# Patient Record
Sex: Female | Born: 1965 | Race: White | Hispanic: No | Marital: Married | Smoking: Former smoker
Health system: Southern US, Community
[De-identification: ages and names within clinical notes are randomized; demographics above are authoritative.]

## PROBLEM LIST (undated history)

## (undated) DIAGNOSIS — N63 Unspecified lump in unspecified breast: Secondary | ICD-10-CM

## (undated) DIAGNOSIS — N61 Mastitis without abscess: Secondary | ICD-10-CM

## (undated) DIAGNOSIS — R102 Pelvic and perineal pain: Secondary | ICD-10-CM

## (undated) DIAGNOSIS — E785 Hyperlipidemia, unspecified: Secondary | ICD-10-CM

## (undated) DIAGNOSIS — C50919 Malignant neoplasm of unspecified site of unspecified female breast: Secondary | ICD-10-CM

## (undated) DIAGNOSIS — R350 Frequency of micturition: Secondary | ICD-10-CM

## (undated) HISTORY — DX: Hyperlipidemia, unspecified: E78.5

## (undated) HISTORY — DX: Frequency of micturition: R35.0

## (undated) HISTORY — DX: Pelvic and perineal pain: R10.2

## (undated) HISTORY — DX: Malignant neoplasm of unspecified site of unspecified female breast: C50.919

## (undated) HISTORY — DX: Mastitis without abscess: N61.0

## (undated) HISTORY — DX: Unspecified lump in unspecified breast: N63.0

---
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
## 2004-03-09 ENCOUNTER — Ambulatory Visit: Payer: Self-pay | Admitting: Family Medicine

## 2004-03-20 ENCOUNTER — Encounter: Admission: RE | Admit: 2004-03-20 | Discharge: 2004-03-20 | Payer: Self-pay | Admitting: Family Medicine

## 2004-03-24 ENCOUNTER — Encounter (INDEPENDENT_AMBULATORY_CARE_PROVIDER_SITE_OTHER): Payer: Self-pay | Admitting: *Deleted

## 2004-04-23 ENCOUNTER — Ambulatory Visit: Payer: Self-pay | Admitting: Family Medicine

## 2004-06-24 HISTORY — PX: DILATION AND CURETTAGE OF UTERUS: SHX78

## 2004-07-13 ENCOUNTER — Ambulatory Visit: Payer: Self-pay | Admitting: Sports Medicine

## 2004-09-04 ENCOUNTER — Ambulatory Visit: Payer: Self-pay | Admitting: Family Medicine

## 2004-10-04 ENCOUNTER — Ambulatory Visit (HOSPITAL_COMMUNITY): Admission: RE | Admit: 2004-10-04 | Discharge: 2004-10-04 | Payer: Self-pay | Admitting: Obstetrics & Gynecology

## 2004-10-04 ENCOUNTER — Encounter (INDEPENDENT_AMBULATORY_CARE_PROVIDER_SITE_OTHER): Payer: Self-pay | Admitting: *Deleted

## 2004-12-31 ENCOUNTER — Other Ambulatory Visit: Admission: RE | Admit: 2004-12-31 | Discharge: 2004-12-31 | Payer: Self-pay | Admitting: Obstetrics & Gynecology

## 2005-04-01 ENCOUNTER — Other Ambulatory Visit: Admission: RE | Admit: 2005-04-01 | Discharge: 2005-04-01 | Payer: Self-pay | Admitting: Obstetrics & Gynecology

## 2005-04-02 ENCOUNTER — Other Ambulatory Visit: Admission: RE | Admit: 2005-04-02 | Discharge: 2005-04-02 | Payer: Self-pay | Admitting: Obstetrics & Gynecology

## 2005-06-24 HISTORY — PX: BLADDER SURGERY: SHX569

## 2005-09-08 ENCOUNTER — Emergency Department (HOSPITAL_COMMUNITY): Admission: EM | Admit: 2005-09-08 | Discharge: 2005-09-08 | Payer: Self-pay | Admitting: Emergency Medicine

## 2005-11-07 ENCOUNTER — Inpatient Hospital Stay (HOSPITAL_COMMUNITY): Admission: AD | Admit: 2005-11-07 | Discharge: 2005-11-09 | Payer: Self-pay | Admitting: Obstetrics and Gynecology

## 2005-11-10 ENCOUNTER — Encounter: Admission: RE | Admit: 2005-11-10 | Discharge: 2005-12-10 | Payer: Self-pay | Admitting: Obstetrics and Gynecology

## 2006-08-22 ENCOUNTER — Encounter (INDEPENDENT_AMBULATORY_CARE_PROVIDER_SITE_OTHER): Payer: Self-pay | Admitting: *Deleted

## 2006-09-16 ENCOUNTER — Encounter (INDEPENDENT_AMBULATORY_CARE_PROVIDER_SITE_OTHER): Payer: Self-pay | Admitting: *Deleted

## 2006-09-16 ENCOUNTER — Ambulatory Visit (HOSPITAL_COMMUNITY): Admission: RE | Admit: 2006-09-16 | Discharge: 2006-09-17 | Payer: Self-pay | Admitting: Obstetrics and Gynecology

## 2008-09-30 ENCOUNTER — Encounter: Admission: RE | Admit: 2008-09-30 | Discharge: 2008-09-30 | Payer: Self-pay | Admitting: Chiropractic Medicine

## 2010-10-18 ENCOUNTER — Other Ambulatory Visit: Payer: Self-pay

## 2010-10-18 ENCOUNTER — Other Ambulatory Visit (HOSPITAL_COMMUNITY)
Admission: RE | Admit: 2010-10-18 | Discharge: 2010-10-18 | Disposition: A | Discharge status: Home / Self Care | Payer: BC Managed Care – PPO | Source: Ambulatory Visit | Attending: Internal Medicine | Admitting: Internal Medicine

## 2010-10-18 ENCOUNTER — Other Ambulatory Visit: Payer: Self-pay | Admitting: Family Medicine

## 2010-10-18 DIAGNOSIS — Z1231 Encounter for screening mammogram for malignant neoplasm of breast: Secondary | ICD-10-CM

## 2010-10-18 DIAGNOSIS — Z01419 Encounter for gynecological examination (general) (routine) without abnormal findings: Secondary | ICD-10-CM | POA: Insufficient documentation

## 2010-11-09 NOTE — Op Note (Signed)
Mandy Jackson, Mandy Jackson                ACCOUNT NO.:  [id number]   MEDICAL RECORD NO.:  [id number]          PATIENT TYPE:  AMB   LOCATION:  SDC                           FACILITY:  [facility]   PHYSICIAN:  Miguel Aschoff, M.D.       DATE OF BIRTH:  1966-01-23   DATE OF PROCEDURE:  09/16/2006  DATE OF DISCHARGE:                               OPERATIVE REPORT   PREOPERATIVE DIAGNOSIS:  Painful episiotomy scar.   POSTOPERATIVE DIAGNOSIS:  Painful episiotomy scar.   PROCEDURE:  Revision of episiotomy scar.   SURGEON:  Dr. Miguel Aschoff   ASSISTANT:  Dr. Conley Simmonds   ANESTHESIA:  General.   COMPLICATIONS:  None.   JUSTIFICATION:  The patient is a 45 year old white female who delivered  on Nov 07, 2005.  The patient has reported dyspareunia in the area of  her episiotomy scar.  Examination reveals an area of fibrosis and  tenderness, and this area is to be excised and revised in an effort to  relieve the dyspareunia.  In addition, the patient has urinary stress  incontinence and a cystocele and prior to the revision of the  episiotomy, a TVT sling and anterior colporrhaphy were carried out by  Dr. Conley Simmonds, and this is dictated as a separate note.   PROCEDURE:  At the completion of the anterior colporrhaphy and TVT  sling, the area of fibrosis involving the episiotomy scar was  identified.  This area was then elevated with Allis clamps, and then  this was dissected out using a scalpel.  After tissue specimen was  excised, there was no longer any thickening felt in the residual portion  of the posterior vaginal wall.  This area was then closed using running  interlocking 2-0 Vicryl Rapide sutures.  Estimated blood loss from this  portion of procedure was minimal, less than 10 mL.  The patient  tolerated the procedure well and was taken to the recovery room in  satisfactory condition.  The patient is to remain in the hospital  overnight because of her anterior colporrhaphy and TVT sling.  The  patient tolerated all the surgical procedures well and was taken to the  recovery room in satisfactory condition.      Miguel Aschoff, M.D.  Electronically Signed     AR/MEDQ  D:  09/16/2006  T:  09/16/2006  Job:  [id number]

## 2010-11-09 NOTE — Discharge Summary (Signed)
Mandy Jackson, Mandy Jackson                ACCOUNT NO.:  [id number]   MEDICAL RECORD NO.:  [id number]          PATIENT TYPE:  OIB   LOCATION:  9315                          FACILITY:  [facility]   PHYSICIAN:  Miguel Aschoff, M.D.       DATE OF BIRTH:  1965/11/01   DATE OF ADMISSION:  09/16/2006  DATE OF DISCHARGE:  09/17/2006                               DISCHARGE SUMMARY   ADMISSION DIAGNOSES:  1. Urinary stress incontinence.  2. Painful scarring at episiotomy site.   FINAL DIAGNOSES:  1. Urinary stress incontinence.  2. Painful scarring at episiotomy site.   OPERATIONS AND PROCEDURES:  Anterior repair.  TVT sling.  Cystoscopy  followed by excision of a revision of episiotomy scar.   COMPLICATIONS:  None.   JUSTIFICATION:  The patient is a 45 year old white female who delivered  on Nov 07, 2005.  This delivery was uncomplicated.  She did, however,  have a second-degree perineal laceration.  Subsequent to this, the  patient had developed significant urinary stress incontinence, and  because of the stress incontinence, she has requested that a procedure  be carried out in an effort to correct this problem.  She was offered  surgical repair with a TVT sling and anterior colporrhaphy.  In  addition, the patient has stated that she keeps having pain in the area  where the perineal tear occurred.  She has very tender area at the right  posterior portion of the vagina near the fourchette, and this area is  going to be incised in an effort to try to reduce the patient's  discomfort in this area.  The risks and benefits were discussed with the  patient, and informed consent was obtained.   HOSPITAL COURSE:  On September 16, 2006 under general anesthesia, an  anterior colporrhaphy, TVT sling and cystoscopy were carried out by Dr.  Edward Jolly.  Following these repairs, the scarred area of the posterior  vagina was outlined, and then this area was excised and then  reapproximated using 2-0 Vicryl Rapide suture.   The patient tolerated  these procedures without difficulty, had an uneventful postoperative  course, and was discharged home on September 16, 2006 in satisfactory  condition.   Plan was for the patient to return to the office in 2 weeks for follow-  up examination.  She was to call for any problems such as fever, pain or  heavy bleeding.   Medications for home were:  1. Percocet 1 every 3 hours as needed for pain.  2. Ibuprofen.  3. Macrobid 100 mg b.i.d.  4. Azo-Standard as needed for dysuria.   The patient is to call for any problems such as fever, pain or heavy  bleeding.      Miguel Aschoff, M.D.  Electronically Signed     AR/MEDQ  D:  10/29/2006  T:  10/29/2006  Job:  [id number]

## 2010-11-09 NOTE — Op Note (Signed)
Mandy Jackson, Mandy Jackson                ACCOUNT NO.:  [id number]   MEDICAL RECORD NO.:  [id number]          PATIENT TYPE:  AMB   LOCATION:  SDC                           FACILITY:  [facility]   PHYSICIAN:  Gerrit Friends. Aldona Bar, M.D.   DATE OF BIRTH:  1965-09-05   DATE OF PROCEDURE:  10/04/2004  DATE OF DISCHARGE:                                 OPERATIVE REPORT   PATIENT'S AGE:  45.   PREOPERATIVE DIAGNOSIS:  First trimester pregnancy loss, blood type A+.   POSTOPERATIVE DIAGNOSIS:  First trimester pregnancy loss, blood type A+.   PATHOLOGY:  Pending - tissue sent for chromosomes as well.   PROCEDURE:  Suction dilatation and curettage.   SURGEON:  Dr. Aldona Bar.   ANESTHESIA:  Intravenous conscious sedation and paracervical block with 1%  Xylocaine without epinephrine.   HISTORY:  This gravida 2, para 0 presented to the office in mid March as a  new OB patient. She had several ultrasounds in the office documenting good  growth and when seen prior to yesterday - on September 12, 2004 - had a 7-week 5-  day fetal pole with a heart beat - everything was consistent with dates and  she was doing well. She returned to the office for a routine visit on October 03, 2004.  Fetal heart could not be heard and an ultrasound was done which  revealed a fetal pole about 9 weeks and 2 days when the patient should have  been 10 weeks and 5 days and no fetal heart or fetal movement was seen. The  diagnosis of first trimester pregnancy loss was made and after a thorough  discussion with the patient and her husband, this procedure was arranged.  Tissue will be sent for chromosomal analysis in view of the patient's age.   The patient is taken to the operating room now for a suction dilatation and  curettage for evacuation of a first trimester pregnancy loss.   PROCEDURE:  The patient was taken to the operating room. After satisfactory  induction of intravenous conscious sedation, the patient was prepped and  draped in the  usual fashion. The bladder was drained of clear urine with a  red rubber catheter via in-and-out fashion. At this time, a speculum was  placed in the vagina. A single-toothed tenaculum was placed on the anterior  lip of the cervix and a paracervical block was carried out with  approximately 18 cc of 1% Xylocaine without epinephrine. With minimal  difficulty thereafter, the internal os was dilated to a #27 Pratt dilator  and thereafter using a #9 suction curet, the cavity was thoroughly gently  and systematically evacuated of products of conception.  There was a  moderate amount of bleeding noted after suctioning. Curettage with a small  standard curet was carried out with a little irregularity noted at the  fundus of the uterus but otherwise the cavity was smooth. Resuctioning  produced a small amount of additional tissue and recurettage produced no  additional tissue. There was still some moderate bleeding but with palpation  of the uterus after all instruments  had been removed, assured of no products  of conception remaining in the cavity, the bleeding slowed considerably and  stopped and the uterus firmed up well. There was a question of a small myoma  on the anterior wall of the uterus as well on the good examination under  anesthesia which was carried out while trying to firm up the uterus after  the procedure.   The patient was watched in the operating room with no further bleeding noted  and ultimately taken to the recovery room in good condition having tolerated  the procedure well. Estimated blood loss was approximately 50-75 cc. All  counts were correct x2. Tissue was sent to [facility] for chromosomal  analysis with all forms properly filled out.   Remaining tissue was sent to pathology.   The patient will be observed in the recovery room and discharged to home  with a prescription for doxycycline 100 mg twice daily for a total of five  days and Anaprox DS as needed for  cramping - to use every eight hours. She  will also be given a prescription for Methergine 0.2 mg to use every four to  six hours should she have some increased bleeding. Follow-up will be  arranged in approximately two weeks hence. Condition on arrival to the  recovery room satisfactory.      RMW/MEDQ  D:  10/04/2004  T:  10/04/2004  Job:  [id number]

## 2010-11-09 NOTE — Op Note (Signed)
NAMELIBERTA, Mandy Jackson                ACCOUNT NO.:  [id number]   MEDICAL RECORD NO.:  [id number]          PATIENT TYPE:  AMB   LOCATION:  SDC                           FACILITY:  [facility]   PHYSICIAN:  Randye Lobo, M.D.   DATE OF BIRTH:  05-16-1966   DATE OF PROCEDURE:  09/16/2006  DATE OF DISCHARGE:                               OPERATIVE REPORT   PREOPERATIVE DIAGNOSES:  1. Stress urinary incontinence.  2. Incomplete vaginal prolapse.   POSTOPERATIVE DIAGNOSES:  1. Stress urinary incontinence.  2. Incomplete vaginal prolapse.   PROCEDURE:  Tension-free vaginal tape suburethral sling with cystoscopy,  anterior colporrhaphy.   SURGEON:  Conley Simmonds, MD   ASSISTANT:  Miguel Aschoff, MD   ANESTHESIA:  General endotracheal.   URINE OUTPUT:  Quantity sufficient.   COMPLICATIONS:  None.   INDICATIONS FOR PROCEDURE:  The patient is a 45 year old Caucasian  female who presented with a complaint of urinary incontinence with  stressful maneuvers.  The urinary incontinence limited her ability to do  exercise and physical activity, and she wished for surgical treatment.  Office urodynamic testing confirmed the presence of genuine stress  incontinence.  On exam, the patient was noted to have a first to second-  degree cystocele.   The patient was planning a revision of a painful episiotomy site with  Dr. Tenny Craw, and the patient wished to proceed with surgical treatment of  her incontinence and prolapse during the same surgical opportunity.  Risks, benefits, and alternatives have been discussed with the patient  who wishes to proceed.   FINDINGS:  Examination under anesthesia revealed a first to second-  degree cystocele.   Cystoscopy documented the bladder to be normal throughout 360 degrees  including the bladder dome and trigone.  Each of the ureters were noted  to be patent bilaterally after an injection of indigo carmine dye IV.  There was no evidence of a foreign body in either the  bladder or the  urethra during placement of the sling.   SPECIMENS:  None.   PROCEDURE:  The patient was reidentified in the preoperative hold area.  She was brought down to the operating room where general endotracheal  anesthesia was induced.  The patient was placed in the dorsal lithotomy  position, and the lower abdomen and vagina and perineum were then  sterilely prepped and draped.  A Foley catheter was placed inside the  bladder.   A weighted speculum was placed inside the vagina, and the anterior  vaginal wall was marked with Allis clamps from the level of 1 cm below  the urethra down to almost the level of the cervix.  The vaginal mucosa  was injected with 0.5% lidocaine with 1:200,000 of epinephrine.  The  vaginal mucosa was incised vertically with a scalpel.  The subvaginal  tissue and bladder were dissected off of the vaginal mucosa bilaterally  using a combination of sharp dissection with the Metzenbaum scissors and  blunt dissection.  The dissection was carried to the level of the pubic  rami bilaterally.  Hemostasis was created during the dissection with  monopolar cautery.   The suprapubic incisions were created with the scalpel, and these  measured approximately 1 cm and were located 2.5 cm to the right and  left of the midline.  The TVT was performed in a top-down fashion.  The  abdominal needle passer was placed through the right suprapubic incision  and then out the vagina at the level of the mid urethra on the  ipsilateral side and lateral to the urethra.  The same procedure that  was performed on the right-hand side was then repeated on the left-hand  side with the other abdominal needle passer.  The Foley catheter was  removed at this time.  A cystoscopy was performed, and the findings are  as noted above.  The bladder was then completely drained of the  cystoscopy fluid, and the Foley catheter was re-placed.  The TVT sling  was then attached to the abdominal  needle passers, and the sling was  brought out through the suprapubic incisions bilaterally.  The plastic  sheaths were then separated from the surrounding sling, and the plastic  sheaths were removed while a Kelly clamp was placed in between the  urethra and the sling.  Excess sling was then trimmed.  The sling was  examined at this time and was thought to perhaps be a bit loose, and so  each of the ends of the suprapubic portion of the sling were then  grasped, and the sling was drawn up into a more supported and resting  position underneath the urethra.  The position at this time was noted to  be satisfactory.  The excess sling material was then trimmed  suprapubically.  The anterior colporrhaphy was performed with a series  of vertical mattress sutures of 2-0 Vicryl.  This provided reduction of  the cystocele.  Excess vaginal mucosa was trimmed, and the anterior  vaginal wall was closed with a running locked suture of 2-0 Vicryl.  The  suprapubic incisions were closed with Dermabond.   At this time, Dr. Tenny Craw performed the remainder of the procedure which  included a revision of the old episiotomy site of the patient.  Please  refer to this dictation separately.   The Foley catheter was set to gravity drainage at the termination of the  procedure.  There were no complications.  The patient was cleansed of  any remaining Betadine and escorted to the recovery room in stable and  awake condition.  All needle, instrument, and sponge counts were  correct.      Randye Lobo, M.D.  Electronically Signed     BES/MEDQ  D:  09/16/2006  T:  09/16/2006  Job:  [id number]

## 2012-05-28 ENCOUNTER — Other Ambulatory Visit: Payer: Self-pay | Admitting: Obstetrics and Gynecology

## 2012-05-28 DIAGNOSIS — R928 Other abnormal and inconclusive findings on diagnostic imaging of breast: Secondary | ICD-10-CM

## 2012-05-29 ENCOUNTER — Ambulatory Visit
Admission: RE | Admit: 2012-05-29 | Discharge: 2012-05-29 | Disposition: A | Discharge status: Home / Self Care | Payer: BC Managed Care – PPO | Source: Ambulatory Visit | Attending: Obstetrics and Gynecology | Admitting: Obstetrics and Gynecology

## 2012-05-29 DIAGNOSIS — R928 Other abnormal and inconclusive findings on diagnostic imaging of breast: Secondary | ICD-10-CM

## 2012-06-04 ENCOUNTER — Other Ambulatory Visit: Payer: BC Managed Care – PPO

## 2013-05-26 ENCOUNTER — Other Ambulatory Visit: Payer: Self-pay | Admitting: Obstetrics and Gynecology

## 2013-07-07 ENCOUNTER — Other Ambulatory Visit: Payer: Self-pay | Admitting: Obstetrics and Gynecology

## 2013-07-07 DIAGNOSIS — N6310 Unspecified lump in the right breast, unspecified quadrant: Secondary | ICD-10-CM

## 2013-07-14 ENCOUNTER — Ambulatory Visit
Admission: RE | Admit: 2013-07-14 | Discharge: 2013-07-14 | Disposition: A | Discharge status: Home / Self Care | Payer: BC Managed Care – PPO | Source: Ambulatory Visit | Attending: Obstetrics and Gynecology | Admitting: Obstetrics and Gynecology

## 2013-07-14 ENCOUNTER — Ambulatory Visit
Admission: RE | Admit: 2013-07-14 | Discharge: 2013-07-14 | Disposition: A | Discharge status: Home / Self Care | Payer: No Typology Code available for payment source | Source: Ambulatory Visit | Attending: Obstetrics and Gynecology | Admitting: Obstetrics and Gynecology

## 2013-07-14 DIAGNOSIS — N6310 Unspecified lump in the right breast, unspecified quadrant: Secondary | ICD-10-CM

## 2014-03-30 ENCOUNTER — Other Ambulatory Visit: Payer: Self-pay | Admitting: Obstetrics and Gynecology

## 2014-04-01 LAB — CYTOLOGY - PAP

## 2014-06-24 HISTORY — PX: COLONOSCOPY: SHX174

## 2015-11-09 DIAGNOSIS — H524 Presbyopia: Secondary | ICD-10-CM | POA: Diagnosis not present

## 2015-11-29 DIAGNOSIS — R05 Cough: Secondary | ICD-10-CM | POA: Diagnosis not present

## 2016-05-08 DIAGNOSIS — Z1231 Encounter for screening mammogram for malignant neoplasm of breast: Secondary | ICD-10-CM | POA: Diagnosis not present

## 2016-05-08 DIAGNOSIS — Z6821 Body mass index (BMI) 21.0-21.9, adult: Secondary | ICD-10-CM | POA: Diagnosis not present

## 2016-05-08 DIAGNOSIS — R319 Hematuria, unspecified: Secondary | ICD-10-CM | POA: Diagnosis not present

## 2016-05-08 DIAGNOSIS — Z01419 Encounter for gynecological examination (general) (routine) without abnormal findings: Secondary | ICD-10-CM | POA: Diagnosis not present

## 2016-06-03 DIAGNOSIS — Z23 Encounter for immunization: Secondary | ICD-10-CM | POA: Diagnosis not present

## 2016-06-03 DIAGNOSIS — J209 Acute bronchitis, unspecified: Secondary | ICD-10-CM | POA: Diagnosis not present

## 2016-06-03 DIAGNOSIS — J329 Chronic sinusitis, unspecified: Secondary | ICD-10-CM | POA: Diagnosis not present

## 2016-10-21 DIAGNOSIS — R399 Unspecified symptoms and signs involving the genitourinary system: Secondary | ICD-10-CM | POA: Diagnosis not present

## 2016-12-31 DIAGNOSIS — D2361 Other benign neoplasm of skin of right upper limb, including shoulder: Secondary | ICD-10-CM | POA: Diagnosis not present

## 2016-12-31 DIAGNOSIS — D1801 Hemangioma of skin and subcutaneous tissue: Secondary | ICD-10-CM | POA: Diagnosis not present

## 2016-12-31 DIAGNOSIS — L814 Other melanin hyperpigmentation: Secondary | ICD-10-CM | POA: Diagnosis not present

## 2016-12-31 DIAGNOSIS — L578 Other skin changes due to chronic exposure to nonionizing radiation: Secondary | ICD-10-CM | POA: Diagnosis not present

## 2016-12-31 DIAGNOSIS — D485 Neoplasm of uncertain behavior of skin: Secondary | ICD-10-CM | POA: Diagnosis not present

## 2016-12-31 DIAGNOSIS — L719 Rosacea, unspecified: Secondary | ICD-10-CM | POA: Diagnosis not present

## 2017-05-20 DIAGNOSIS — Z1231 Encounter for screening mammogram for malignant neoplasm of breast: Secondary | ICD-10-CM | POA: Diagnosis not present

## 2017-05-20 DIAGNOSIS — N390 Urinary tract infection, site not specified: Secondary | ICD-10-CM | POA: Diagnosis not present

## 2017-05-20 DIAGNOSIS — Z124 Encounter for screening for malignant neoplasm of cervix: Secondary | ICD-10-CM | POA: Diagnosis not present

## 2017-05-20 DIAGNOSIS — Z01419 Encounter for gynecological examination (general) (routine) without abnormal findings: Secondary | ICD-10-CM | POA: Diagnosis not present

## 2017-05-20 DIAGNOSIS — Z6823 Body mass index (BMI) 23.0-23.9, adult: Secondary | ICD-10-CM | POA: Diagnosis not present

## 2017-05-26 ENCOUNTER — Other Ambulatory Visit: Payer: Self-pay | Admitting: Obstetrics and Gynecology

## 2017-05-26 DIAGNOSIS — R928 Other abnormal and inconclusive findings on diagnostic imaging of breast: Secondary | ICD-10-CM

## 2017-05-29 ENCOUNTER — Ambulatory Visit: Payer: No Typology Code available for payment source

## 2017-05-29 ENCOUNTER — Ambulatory Visit
Admission: RE | Admit: 2017-05-29 | Discharge: 2017-05-29 | Disposition: A | Discharge status: Home / Self Care | Payer: No Typology Code available for payment source | Source: Ambulatory Visit | Attending: Obstetrics and Gynecology | Admitting: Obstetrics and Gynecology

## 2017-05-29 DIAGNOSIS — R928 Other abnormal and inconclusive findings on diagnostic imaging of breast: Secondary | ICD-10-CM

## 2017-05-29 DIAGNOSIS — R922 Inconclusive mammogram: Secondary | ICD-10-CM | POA: Diagnosis not present

## 2017-06-18 DIAGNOSIS — R300 Dysuria: Secondary | ICD-10-CM | POA: Diagnosis not present

## 2017-06-18 DIAGNOSIS — Z6823 Body mass index (BMI) 23.0-23.9, adult: Secondary | ICD-10-CM | POA: Diagnosis not present

## 2017-12-29 IMAGING — MG 2D DIGITAL DIAGNOSTIC UNILATERAL RIGHT MAMMOGRAM WITH CAD AND AD
9 of 12 series · 9 of 28 positions shown · non-contrast
Comparison: Previous exam(s).

CLINICAL DATA: Patient returns today to evaluate a possible right
breast mass questioned on recent screening mammogram.History of
breast cysts.

EXAM:
2D DIGITAL DIAGNOSTIC UNILATERAL RIGHT MAMMOGRAM WITH CAD AND
ADJUNCT TOMO

[R ML synth-2D]
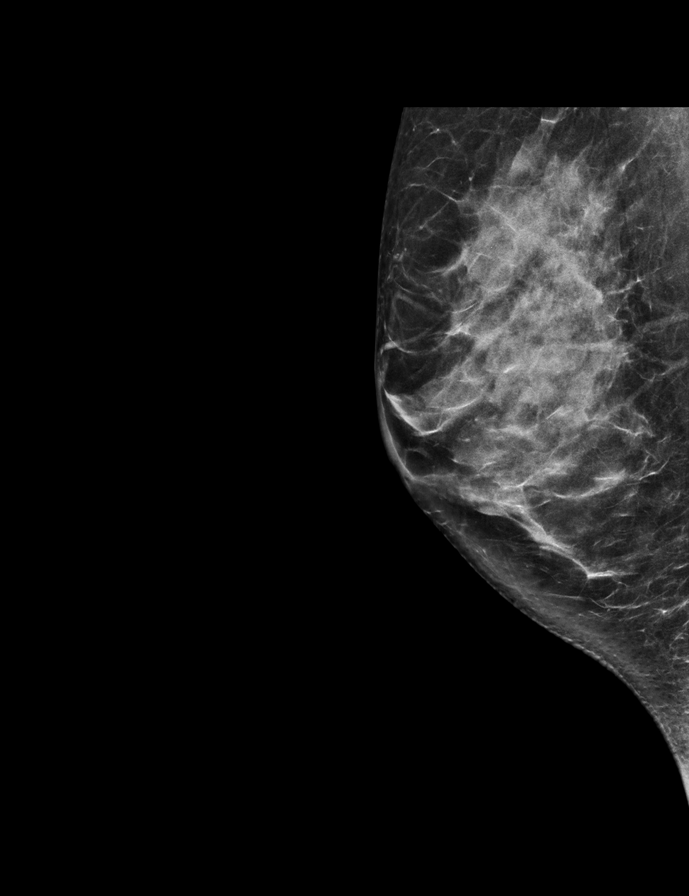

[R MLO synth-2D (1 of 2)]
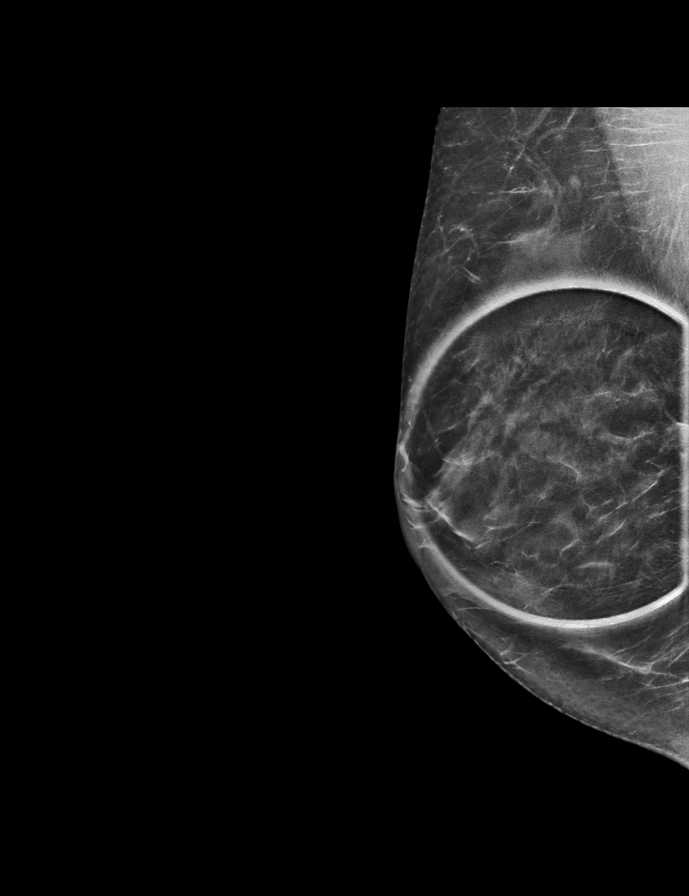

[R MLO (1 of 2)]
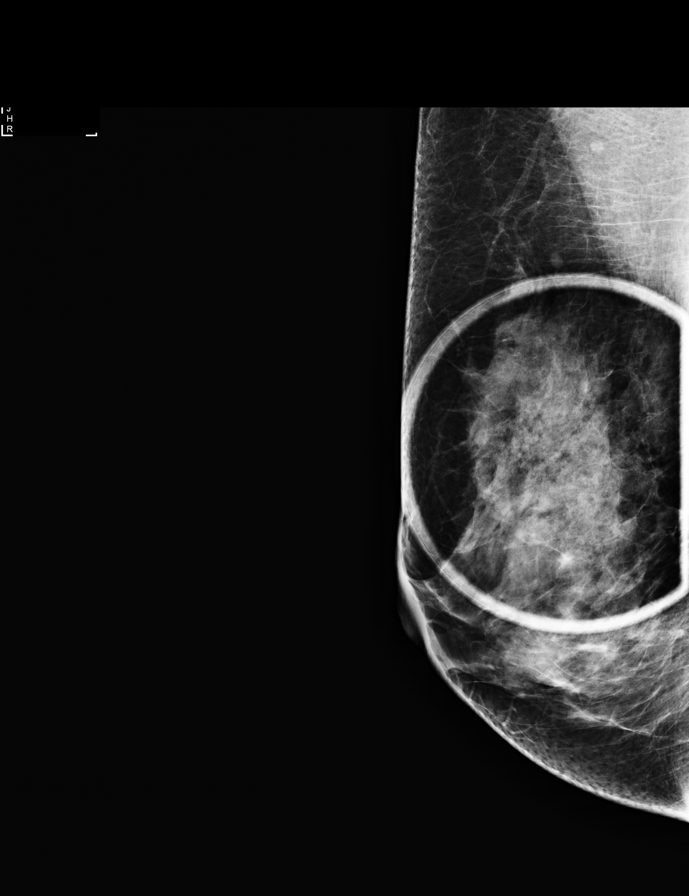

[R CC]
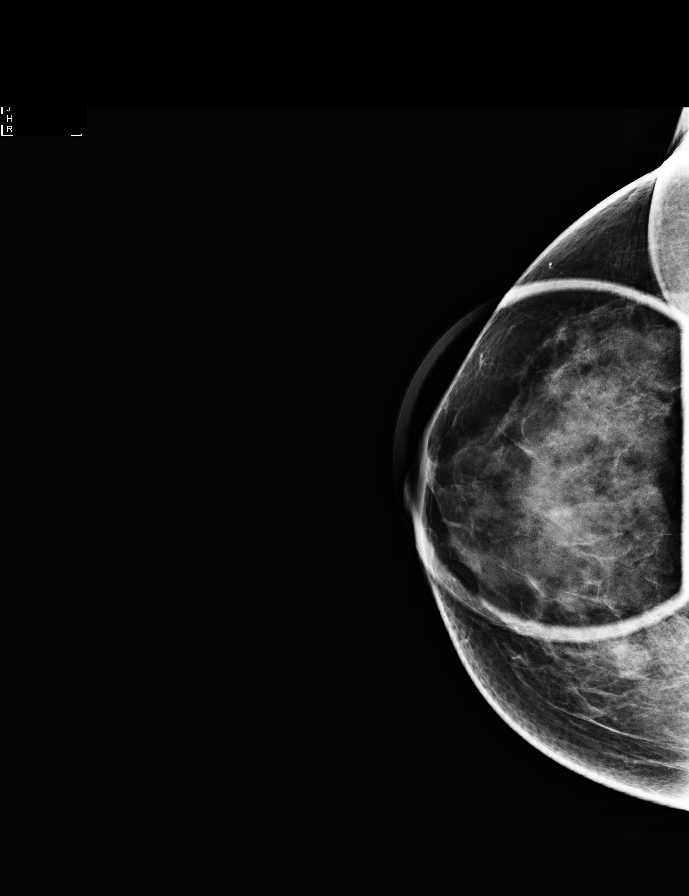

[R MLO (2 of 2)]
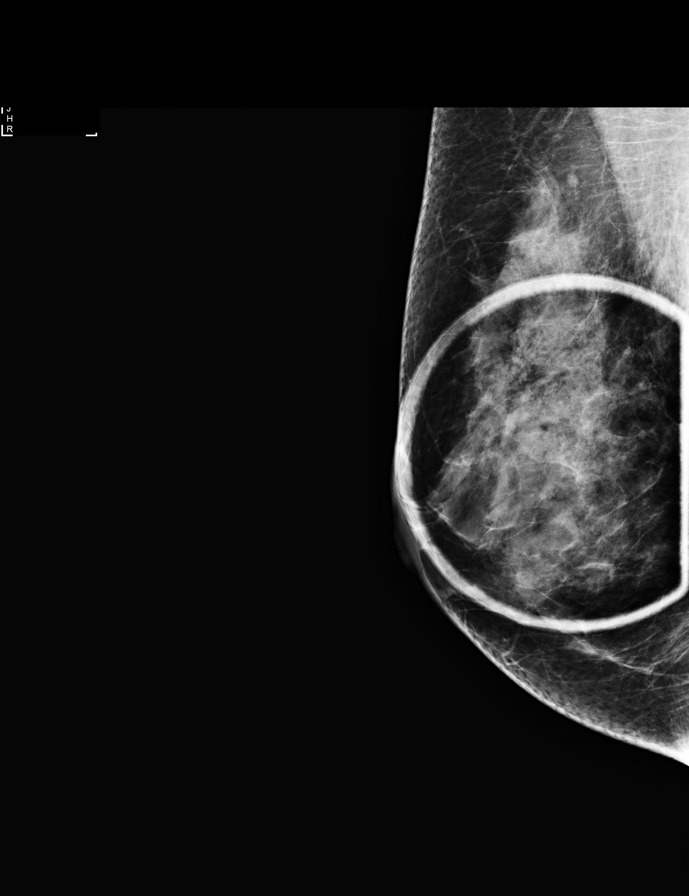

[R MLO synth-2D (2 of 2)]
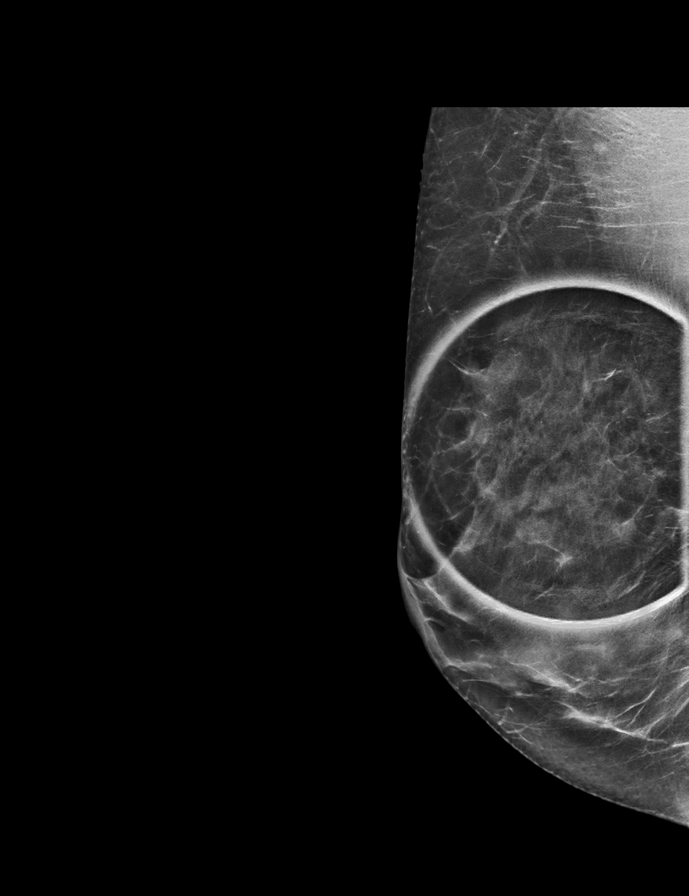

[R ML]
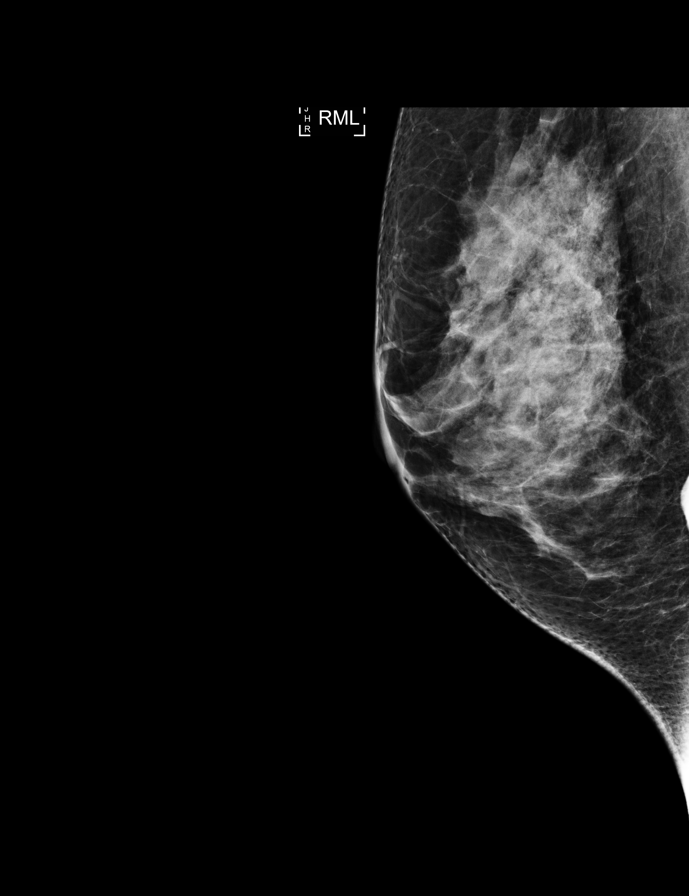

[R CC synth-2D]
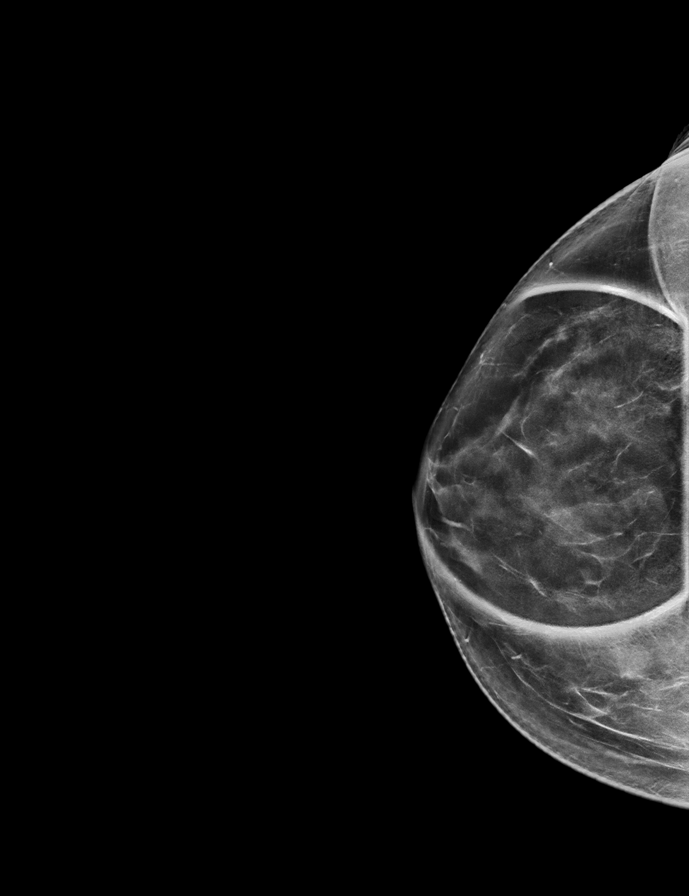

[R MLO tomo · tomo slice 31/61.0]
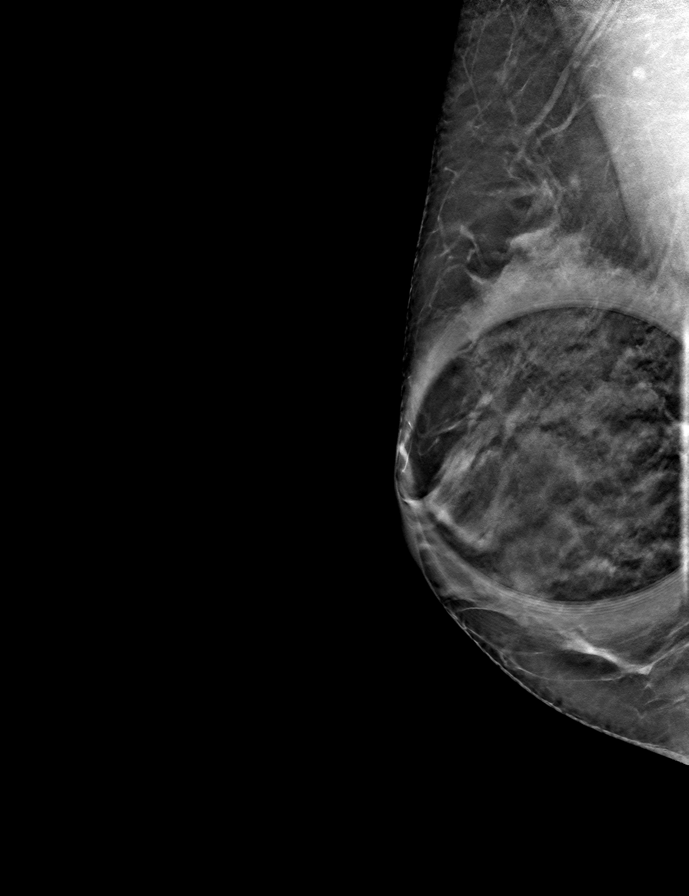

[9 of 28 positions shown; findings below may reference images not displayed]

ACR Breast Density Category d: The breast tissue is extremely dense,
which lowers the sensitivity of mammography.
FINDINGS: On today's additional diagnostic views, including spot compression
with 3D tomosynthesis, there is no persistent abnormality within the
right breast indicating superimposition of normal dense
fibroglandular tissues. There are no new dominant masses, suspicious
calcifications or secondary signs of malignancy.

Mammographic images were processed with CAD.
IMPRESSION: No evidence of malignancy. Patient may return to routine annual
bilateral screening mammogram schedule.

RECOMMENDATION:
Screening mammogram in one year.(Code:[id number]-X-Z2B)

I have discussed the findings and recommendations with the patient.
Results were also provided in writing at the conclusion of the
visit. If applicable, a reminder letter will be sent to the patient
regarding the next appointment.

BI-RADS CATEGORY  1: Negative.

## 2018-04-27 DIAGNOSIS — Z23 Encounter for immunization: Secondary | ICD-10-CM | POA: Diagnosis not present

## 2018-04-27 DIAGNOSIS — Z1159 Encounter for screening for other viral diseases: Secondary | ICD-10-CM | POA: Diagnosis not present

## 2018-04-27 DIAGNOSIS — Z0000 Encounter for general adult medical examination without abnormal findings: Secondary | ICD-10-CM | POA: Diagnosis not present

## 2018-04-27 DIAGNOSIS — Z1211 Encounter for screening for malignant neoplasm of colon: Secondary | ICD-10-CM | POA: Diagnosis not present

## 2018-05-12 DIAGNOSIS — Z0000 Encounter for general adult medical examination without abnormal findings: Secondary | ICD-10-CM | POA: Diagnosis not present

## 2018-05-12 DIAGNOSIS — Z1231 Encounter for screening mammogram for malignant neoplasm of breast: Secondary | ICD-10-CM | POA: Diagnosis not present

## 2018-05-12 DIAGNOSIS — Z01419 Encounter for gynecological examination (general) (routine) without abnormal findings: Secondary | ICD-10-CM | POA: Diagnosis not present

## 2018-05-12 DIAGNOSIS — Z6822 Body mass index (BMI) 22.0-22.9, adult: Secondary | ICD-10-CM | POA: Diagnosis not present

## 2018-05-12 DIAGNOSIS — R350 Frequency of micturition: Secondary | ICD-10-CM | POA: Diagnosis not present

## 2018-05-15 ENCOUNTER — Other Ambulatory Visit: Payer: Self-pay | Admitting: Obstetrics and Gynecology

## 2018-05-15 DIAGNOSIS — E2839 Other primary ovarian failure: Secondary | ICD-10-CM

## 2018-07-09 ENCOUNTER — Other Ambulatory Visit: Payer: BLUE CROSS/BLUE SHIELD

## 2018-07-09 DIAGNOSIS — Z6822 Body mass index (BMI) 22.0-22.9, adult: Secondary | ICD-10-CM | POA: Diagnosis not present

## 2018-07-09 DIAGNOSIS — R350 Frequency of micturition: Secondary | ICD-10-CM | POA: Diagnosis not present

## 2018-07-17 ENCOUNTER — Encounter: Payer: Self-pay | Admitting: Cardiology

## 2018-07-17 DIAGNOSIS — R32 Unspecified urinary incontinence: Secondary | ICD-10-CM

## 2018-07-17 DIAGNOSIS — R079 Chest pain, unspecified: Secondary | ICD-10-CM

## 2018-07-17 DIAGNOSIS — N61 Mastitis without abscess: Secondary | ICD-10-CM

## 2018-07-17 DIAGNOSIS — R102 Pelvic and perineal pain: Secondary | ICD-10-CM

## 2018-07-17 DIAGNOSIS — N63 Unspecified lump in unspecified breast: Secondary | ICD-10-CM

## 2018-07-17 DIAGNOSIS — E785 Hyperlipidemia, unspecified: Secondary | ICD-10-CM | POA: Insufficient documentation

## 2018-07-22 DIAGNOSIS — M545 Low back pain: Secondary | ICD-10-CM | POA: Diagnosis not present

## 2018-07-22 DIAGNOSIS — Z6822 Body mass index (BMI) 22.0-22.9, adult: Secondary | ICD-10-CM | POA: Diagnosis not present

## 2018-07-29 ENCOUNTER — Ambulatory Visit: Payer: BLUE CROSS/BLUE SHIELD | Admitting: Cardiology

## 2018-08-17 ENCOUNTER — Ambulatory Visit: Payer: BLUE CROSS/BLUE SHIELD | Admitting: Cardiology

## 2018-08-27 ENCOUNTER — Other Ambulatory Visit: Payer: BLUE CROSS/BLUE SHIELD

## 2019-02-11 DIAGNOSIS — N760 Acute vaginitis: Secondary | ICD-10-CM | POA: Diagnosis not present

## 2019-02-11 DIAGNOSIS — N764 Abscess of vulva: Secondary | ICD-10-CM | POA: Diagnosis not present

## 2019-02-11 DIAGNOSIS — R300 Dysuria: Secondary | ICD-10-CM | POA: Diagnosis not present

## 2019-02-11 DIAGNOSIS — Z6823 Body mass index (BMI) 23.0-23.9, adult: Secondary | ICD-10-CM | POA: Diagnosis not present

## 2019-04-28 DIAGNOSIS — Z7189 Other specified counseling: Secondary | ICD-10-CM | POA: Diagnosis not present

## 2019-04-28 DIAGNOSIS — R519 Headache, unspecified: Secondary | ICD-10-CM | POA: Diagnosis not present

## 2019-04-28 DIAGNOSIS — Z20828 Contact with and (suspected) exposure to other viral communicable diseases: Secondary | ICD-10-CM | POA: Diagnosis not present

## 2019-04-28 DIAGNOSIS — R509 Fever, unspecified: Secondary | ICD-10-CM | POA: Diagnosis not present

## 2019-05-13 DIAGNOSIS — Z0000 Encounter for general adult medical examination without abnormal findings: Secondary | ICD-10-CM | POA: Diagnosis not present

## 2019-08-11 DIAGNOSIS — Z1231 Encounter for screening mammogram for malignant neoplasm of breast: Secondary | ICD-10-CM | POA: Diagnosis not present

## 2019-08-11 DIAGNOSIS — Z6823 Body mass index (BMI) 23.0-23.9, adult: Secondary | ICD-10-CM | POA: Diagnosis not present

## 2019-08-11 DIAGNOSIS — Z01419 Encounter for gynecological examination (general) (routine) without abnormal findings: Secondary | ICD-10-CM | POA: Diagnosis not present

## 2019-10-08 ENCOUNTER — Ambulatory Visit: Payer: BC Managed Care – PPO | Attending: Internal Medicine

## 2019-10-08 DIAGNOSIS — Z23 Encounter for immunization: Secondary | ICD-10-CM

## 2019-10-08 NOTE — Progress Notes (Signed)
   Covid-19 Vaccination Clinic  Name:  Mandy Jackson    MRN: [id number] DOB: Nov 14, 1965  10/08/2019  [name] was observed post Covid-19 immunization for 15 minutes without incident. She was provided with Vaccine Information Sheet and instruction to access the V-Safe system.   [name] was instructed to call 911 with any severe reactions post vaccine: Marland Kitchen Difficulty breathing  . Swelling of face and throat  . A fast heartbeat  . A bad rash all over body  . Dizziness and weakness   Immunizations Administered    Name Date Dose VIS Date Route   Pfizer COVID-19 Vaccine 10/08/2019  1:36 PM 0.3 mL 06/04/2019 Intramuscular   Manufacturer: [place]   Lot: [id number]   [place]: [id number]

## 2019-10-14 DIAGNOSIS — Z03818 Encounter for observation for suspected exposure to other biological agents ruled out: Secondary | ICD-10-CM | POA: Diagnosis not present

## 2019-10-14 DIAGNOSIS — Z20822 Contact with and (suspected) exposure to covid-19: Secondary | ICD-10-CM | POA: Diagnosis not present

## 2019-11-02 ENCOUNTER — Ambulatory Visit: Payer: BC Managed Care – PPO

## 2019-11-08 ENCOUNTER — Ambulatory Visit: Payer: BC Managed Care – PPO | Attending: Internal Medicine

## 2019-11-08 DIAGNOSIS — Z23 Encounter for immunization: Secondary | ICD-10-CM

## 2019-11-08 NOTE — Progress Notes (Signed)
   Covid-19 Vaccination Clinic  Name:  Mandy Jackson    MRN: [id number] DOB: 1965/07/20  11/08/2019  Mandy Jackson was observed post Covid-19 immunization for 15 minutes without incident. She was provided with Vaccine Information Sheet and instruction to access the V-Safe system.   Mandy Jackson was instructed to call 911 with any severe reactions post vaccine: Marland Kitchen Difficulty breathing  . Swelling of face and throat  . A fast heartbeat  . A bad rash all over body  . Dizziness and weakness   Immunizations Administered    Name Date Dose VIS Date Route   Pfizer COVID-19 Vaccine 11/08/2019  1:19 PM 0.3 mL 08/18/2018 Intramuscular   Manufacturer: [place]   Lot: [id number]   [place]: [id number]

## 2020-04-28 DIAGNOSIS — Z0000 Encounter for general adult medical examination without abnormal findings: Secondary | ICD-10-CM | POA: Diagnosis not present

## 2020-05-02 DIAGNOSIS — M7502 Adhesive capsulitis of left shoulder: Secondary | ICD-10-CM | POA: Diagnosis not present

## 2020-05-17 DIAGNOSIS — S0501XA Injury of conjunctiva and corneal abrasion without foreign body, right eye, initial encounter: Secondary | ICD-10-CM | POA: Diagnosis not present

## 2020-07-01 DIAGNOSIS — Z1152 Encounter for screening for COVID-19: Secondary | ICD-10-CM | POA: Diagnosis not present

## 2020-08-15 DIAGNOSIS — Z1231 Encounter for screening mammogram for malignant neoplasm of breast: Secondary | ICD-10-CM | POA: Diagnosis not present

## 2020-08-15 DIAGNOSIS — R748 Abnormal levels of other serum enzymes: Secondary | ICD-10-CM | POA: Diagnosis not present

## 2020-08-15 DIAGNOSIS — Z01419 Encounter for gynecological examination (general) (routine) without abnormal findings: Secondary | ICD-10-CM | POA: Diagnosis not present

## 2021-02-14 DIAGNOSIS — U071 COVID-19: Secondary | ICD-10-CM | POA: Diagnosis not present

## 2021-05-01 DIAGNOSIS — Z0000 Encounter for general adult medical examination without abnormal findings: Secondary | ICD-10-CM | POA: Diagnosis not present

## 2021-09-19 DIAGNOSIS — F4310 Post-traumatic stress disorder, unspecified: Secondary | ICD-10-CM | POA: Diagnosis not present

## 2021-10-10 DIAGNOSIS — F4310 Post-traumatic stress disorder, unspecified: Secondary | ICD-10-CM | POA: Diagnosis not present

## 2021-10-31 DIAGNOSIS — F4310 Post-traumatic stress disorder, unspecified: Secondary | ICD-10-CM | POA: Diagnosis not present

## 2021-11-01 DIAGNOSIS — T23201A Burn of second degree of right hand, unspecified site, initial encounter: Secondary | ICD-10-CM | POA: Diagnosis not present

## 2021-11-01 DIAGNOSIS — M79641 Pain in right hand: Secondary | ICD-10-CM | POA: Diagnosis not present

## 2021-11-27 DIAGNOSIS — Z6821 Body mass index (BMI) 21.0-21.9, adult: Secondary | ICD-10-CM | POA: Diagnosis not present

## 2021-11-27 DIAGNOSIS — R8271 Bacteriuria: Secondary | ICD-10-CM | POA: Diagnosis not present

## 2021-11-27 DIAGNOSIS — Z124 Encounter for screening for malignant neoplasm of cervix: Secondary | ICD-10-CM | POA: Diagnosis not present

## 2021-11-27 DIAGNOSIS — Z1231 Encounter for screening mammogram for malignant neoplasm of breast: Secondary | ICD-10-CM | POA: Diagnosis not present

## 2021-11-27 DIAGNOSIS — Z0000 Encounter for general adult medical examination without abnormal findings: Secondary | ICD-10-CM | POA: Diagnosis not present

## 2021-11-27 DIAGNOSIS — Z01419 Encounter for gynecological examination (general) (routine) without abnormal findings: Secondary | ICD-10-CM | POA: Diagnosis not present

## 2021-12-05 DIAGNOSIS — F4310 Post-traumatic stress disorder, unspecified: Secondary | ICD-10-CM | POA: Diagnosis not present

## 2022-01-01 DIAGNOSIS — Z87440 Personal history of urinary (tract) infections: Secondary | ICD-10-CM | POA: Diagnosis not present

## 2022-01-02 DIAGNOSIS — F4310 Post-traumatic stress disorder, unspecified: Secondary | ICD-10-CM | POA: Diagnosis not present

## 2022-01-23 DIAGNOSIS — F4323 Adjustment disorder with mixed anxiety and depressed mood: Secondary | ICD-10-CM | POA: Diagnosis not present

## 2022-02-06 DIAGNOSIS — F4310 Post-traumatic stress disorder, unspecified: Secondary | ICD-10-CM | POA: Diagnosis not present

## 2022-02-27 DIAGNOSIS — F4310 Post-traumatic stress disorder, unspecified: Secondary | ICD-10-CM | POA: Diagnosis not present

## 2022-04-10 DIAGNOSIS — F4310 Post-traumatic stress disorder, unspecified: Secondary | ICD-10-CM | POA: Diagnosis not present

## 2022-05-08 DIAGNOSIS — F4310 Post-traumatic stress disorder, unspecified: Secondary | ICD-10-CM | POA: Diagnosis not present

## 2022-05-29 DIAGNOSIS — F4310 Post-traumatic stress disorder, unspecified: Secondary | ICD-10-CM | POA: Diagnosis not present

## 2022-06-05 DIAGNOSIS — N3946 Mixed incontinence: Secondary | ICD-10-CM | POA: Diagnosis not present
